# Patient Record
Sex: Female | Born: 1970 | Race: Black or African American | Hispanic: No | Marital: Single
Health system: Southern US, Community
[De-identification: ages and names within clinical notes are randomized; demographics above are authoritative.]

---

## 2000-08-12 ENCOUNTER — Emergency Department (HOSPITAL_COMMUNITY): Admission: EM | Admit: 2000-08-12 | Discharge: 2000-08-12 | Payer: Self-pay | Admitting: Internal Medicine

## 2000-08-12 ENCOUNTER — Encounter: Payer: Self-pay | Admitting: Emergency Medicine

## 2001-07-16 ENCOUNTER — Inpatient Hospital Stay (HOSPITAL_COMMUNITY): Admission: AD | Admit: 2001-07-16 | Discharge: 2001-07-30 | Payer: Self-pay | Admitting: Obstetrics and Gynecology

## 2001-07-16 ENCOUNTER — Encounter: Payer: Self-pay | Admitting: Obstetrics and Gynecology

## 2001-07-16 ENCOUNTER — Encounter (INDEPENDENT_AMBULATORY_CARE_PROVIDER_SITE_OTHER): Payer: Self-pay | Admitting: *Deleted

## 2001-07-26 ENCOUNTER — Encounter: Payer: Self-pay | Admitting: *Deleted

## 2002-03-24 ENCOUNTER — Encounter: Payer: Self-pay | Admitting: Emergency Medicine

## 2002-03-24 ENCOUNTER — Emergency Department (HOSPITAL_COMMUNITY): Admission: EM | Admit: 2002-03-24 | Discharge: 2002-03-25 | Payer: Self-pay | Admitting: Emergency Medicine

## 2007-09-01 ENCOUNTER — Ambulatory Visit (HOSPITAL_COMMUNITY): Admission: RE | Admit: 2007-09-01 | Discharge: 2007-09-01 | Payer: Self-pay | Admitting: Obstetrics & Gynecology

## 2007-09-14 ENCOUNTER — Ambulatory Visit: Payer: Self-pay | Admitting: Obstetrics & Gynecology

## 2007-09-18 ENCOUNTER — Ambulatory Visit (HOSPITAL_COMMUNITY): Admission: RE | Admit: 2007-09-18 | Discharge: 2007-09-18 | Payer: Self-pay | Admitting: Family Medicine

## 2007-10-12 ENCOUNTER — Ambulatory Visit: Payer: Self-pay | Admitting: *Deleted

## 2007-10-16 ENCOUNTER — Ambulatory Visit (HOSPITAL_COMMUNITY): Admission: RE | Admit: 2007-10-16 | Discharge: 2007-10-16 | Payer: Self-pay | Admitting: Family Medicine

## 2007-10-16 ENCOUNTER — Ambulatory Visit: Payer: Self-pay | Admitting: Obstetrics & Gynecology

## 2007-10-23 ENCOUNTER — Ambulatory Visit: Payer: Self-pay | Admitting: Family Medicine

## 2007-10-30 ENCOUNTER — Ambulatory Visit: Payer: Self-pay | Admitting: Obstetrics & Gynecology

## 2007-11-06 ENCOUNTER — Ambulatory Visit (HOSPITAL_COMMUNITY): Admission: RE | Admit: 2007-11-06 | Discharge: 2007-11-06 | Payer: Self-pay | Admitting: Obstetrics & Gynecology

## 2007-11-06 ENCOUNTER — Ambulatory Visit: Payer: Self-pay | Admitting: Obstetrics & Gynecology

## 2007-11-13 ENCOUNTER — Ambulatory Visit: Payer: Self-pay | Admitting: Obstetrics & Gynecology

## 2007-11-14 ENCOUNTER — Ambulatory Visit (HOSPITAL_COMMUNITY): Admission: RE | Admit: 2007-11-14 | Discharge: 2007-11-14 | Payer: Self-pay | Admitting: Family Medicine

## 2007-11-20 ENCOUNTER — Ambulatory Visit: Payer: Self-pay | Admitting: Obstetrics and Gynecology

## 2007-11-26 ENCOUNTER — Ambulatory Visit: Payer: Self-pay | Admitting: Physician Assistant

## 2007-11-26 ENCOUNTER — Inpatient Hospital Stay (HOSPITAL_COMMUNITY): Admission: AD | Admit: 2007-11-26 | Discharge: 2007-11-26 | Payer: Self-pay | Admitting: Obstetrics & Gynecology

## 2007-11-27 ENCOUNTER — Ambulatory Visit: Payer: Self-pay | Admitting: Obstetrics & Gynecology

## 2007-12-04 ENCOUNTER — Ambulatory Visit (HOSPITAL_COMMUNITY): Admission: RE | Admit: 2007-12-04 | Discharge: 2007-12-04 | Payer: Self-pay | Admitting: Obstetrics & Gynecology

## 2007-12-04 ENCOUNTER — Ambulatory Visit: Payer: Self-pay | Admitting: Family Medicine

## 2007-12-11 ENCOUNTER — Ambulatory Visit (HOSPITAL_COMMUNITY): Admission: RE | Admit: 2007-12-11 | Discharge: 2007-12-11 | Payer: Self-pay | Admitting: Family Medicine

## 2007-12-11 ENCOUNTER — Ambulatory Visit: Payer: Self-pay | Admitting: Obstetrics & Gynecology

## 2007-12-18 ENCOUNTER — Ambulatory Visit: Payer: Self-pay | Admitting: Obstetrics & Gynecology

## 2007-12-28 ENCOUNTER — Ambulatory Visit: Payer: Self-pay | Admitting: Obstetrics & Gynecology

## 2008-01-01 ENCOUNTER — Ambulatory Visit (HOSPITAL_COMMUNITY): Admission: RE | Admit: 2008-01-01 | Discharge: 2008-01-01 | Payer: Self-pay | Admitting: Family Medicine

## 2008-01-04 ENCOUNTER — Ambulatory Visit: Payer: Self-pay | Admitting: Gynecology

## 2008-01-11 ENCOUNTER — Ambulatory Visit: Payer: Self-pay | Admitting: Obstetrics & Gynecology

## 2008-01-18 ENCOUNTER — Ambulatory Visit: Payer: Self-pay | Admitting: Obstetrics & Gynecology

## 2008-01-22 ENCOUNTER — Ambulatory Visit (HOSPITAL_COMMUNITY): Admission: RE | Admit: 2008-01-22 | Discharge: 2008-01-22 | Payer: Self-pay | Admitting: Family Medicine

## 2008-01-25 ENCOUNTER — Ambulatory Visit: Payer: Self-pay | Admitting: Obstetrics & Gynecology

## 2008-01-31 ENCOUNTER — Ambulatory Visit: Payer: Self-pay | Admitting: Obstetrics & Gynecology

## 2008-02-08 ENCOUNTER — Ambulatory Visit: Payer: Self-pay | Admitting: Family Medicine

## 2008-02-08 ENCOUNTER — Encounter: Payer: Self-pay | Admitting: Obstetrics & Gynecology

## 2008-02-15 ENCOUNTER — Ambulatory Visit: Payer: Self-pay | Admitting: Obstetrics & Gynecology

## 2008-02-22 ENCOUNTER — Ambulatory Visit: Payer: Self-pay | Admitting: Obstetrics & Gynecology

## 2008-02-29 ENCOUNTER — Ambulatory Visit: Payer: Self-pay | Admitting: Family Medicine

## 2008-03-07 ENCOUNTER — Ambulatory Visit: Payer: Self-pay | Admitting: Obstetrics & Gynecology

## 2008-03-07 ENCOUNTER — Encounter: Payer: Self-pay | Admitting: Obstetrics & Gynecology

## 2008-03-08 ENCOUNTER — Encounter: Payer: Self-pay | Admitting: Obstetrics & Gynecology

## 2008-03-08 LAB — CONVERTED CEMR LAB
Chlamydia, DNA Probe: NEGATIVE
GC Probe Amp, Genital: NEGATIVE

## 2008-03-21 ENCOUNTER — Ambulatory Visit: Payer: Self-pay | Admitting: Obstetrics & Gynecology

## 2008-03-21 ENCOUNTER — Inpatient Hospital Stay (HOSPITAL_COMMUNITY): Admission: AD | Admit: 2008-03-21 | Discharge: 2008-03-24 | Payer: Self-pay | Admitting: Obstetrics & Gynecology

## 2008-03-21 ENCOUNTER — Ambulatory Visit: Payer: Self-pay | Admitting: Family Medicine

## 2008-03-21 LAB — CONVERTED CEMR LAB
AST: 22 units/L (ref 0–37)
Albumin: 2.9 g/dL — ABNORMAL LOW (ref 3.5–5.2)
BUN: 4 mg/dL — ABNORMAL LOW (ref 6–23)
CO2: 24 meq/L (ref 19–32)
Calcium: 9.1 mg/dL (ref 8.4–10.5)
Chloride: 102 meq/L (ref 96–112)
Glucose, Bld: 78 mg/dL (ref 70–99)
Hemoglobin: 12.6 g/dL (ref 12.0–15.0)
Potassium: 4.2 meq/L (ref 3.5–5.3)
RBC: 4.19 M/uL (ref 3.87–5.11)
WBC: 6.8 10*3/uL (ref 4.0–10.5)

## 2008-04-30 ENCOUNTER — Inpatient Hospital Stay (HOSPITAL_COMMUNITY): Admission: AD | Admit: 2008-04-30 | Discharge: 2008-04-30 | Payer: Self-pay | Admitting: Obstetrics & Gynecology

## 2008-04-30 ENCOUNTER — Ambulatory Visit: Payer: Self-pay | Admitting: Family Medicine

## 2010-04-16 IMAGING — US US OB FOLLOW-UP
1 series · 18 of 28 positions shown · non-contrast
Comparison: none

OBSTETRICAL ULTRASOUND:
 This ultrasound was performed in The [HOSPITAL], and the [facility] report will be stored to [REDACTED] PACS.

[Series 1: us ob follow-up · 18 of 36 slices shown]
[im 1/36]
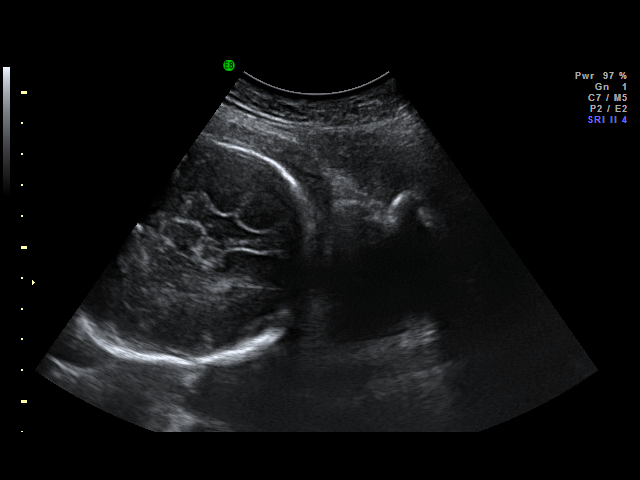
[im 3/36]
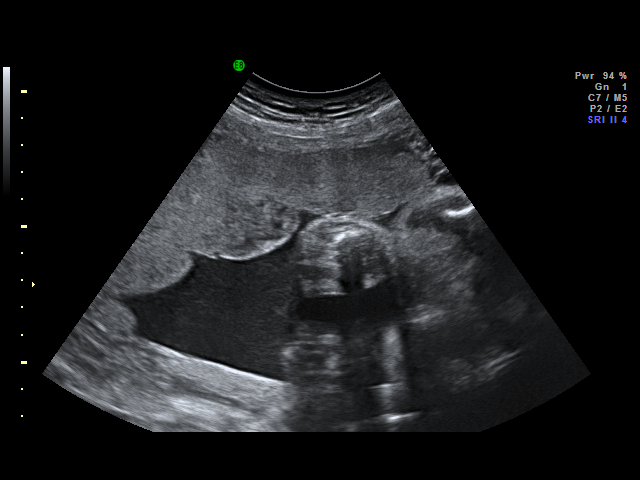
[im 4/36]
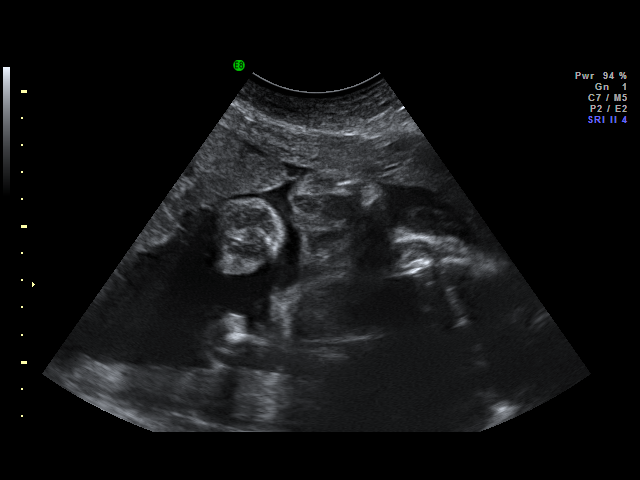
[im 7/36]
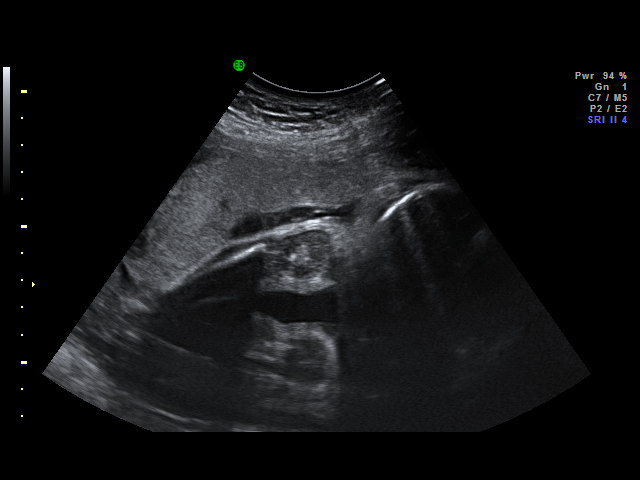
[im 10/36]
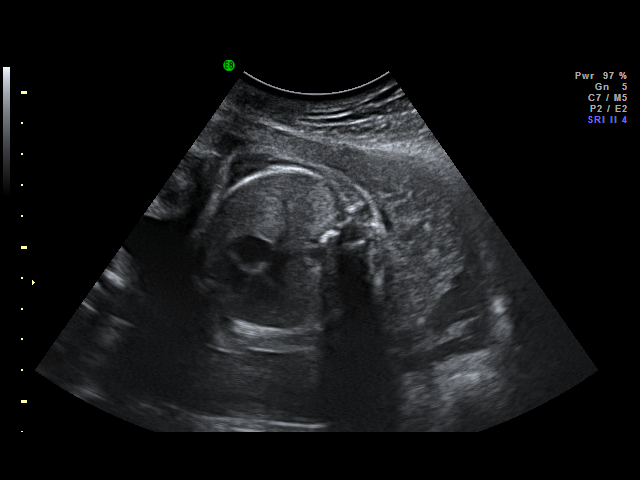
[im 11/36]
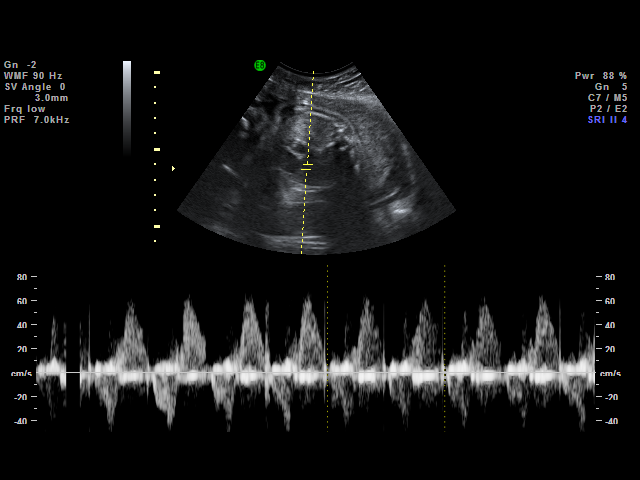
[im 13/36]
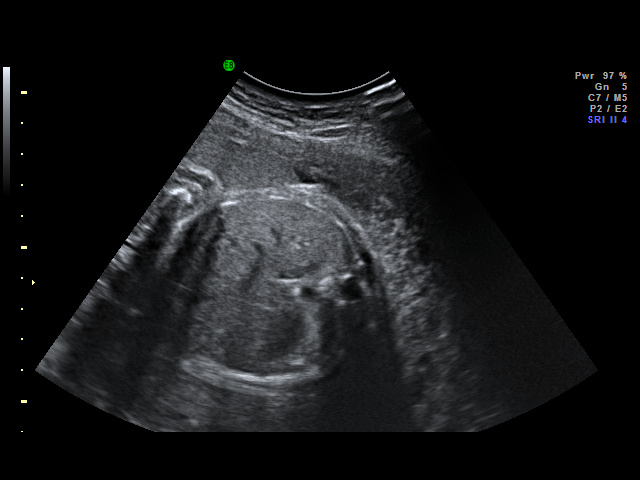
[im 15/36]
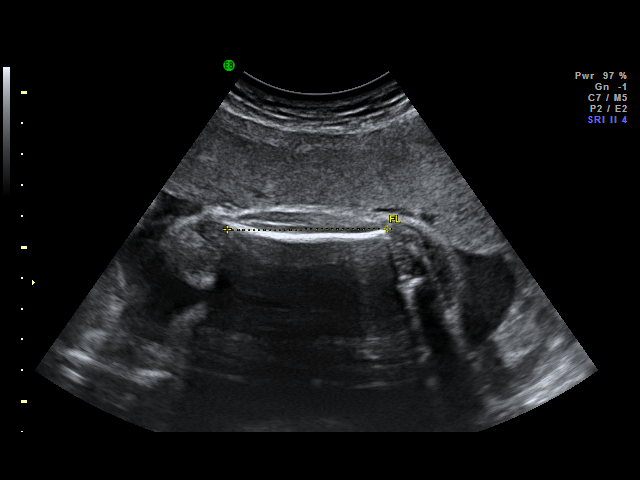
[im 17/36]
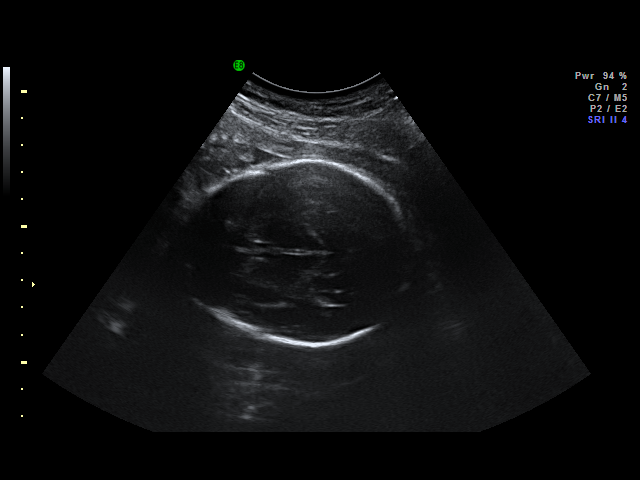
[im 19/36]
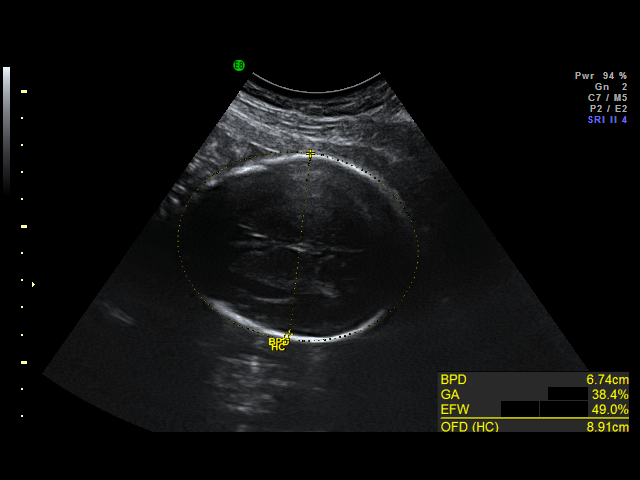
[im 21/36]
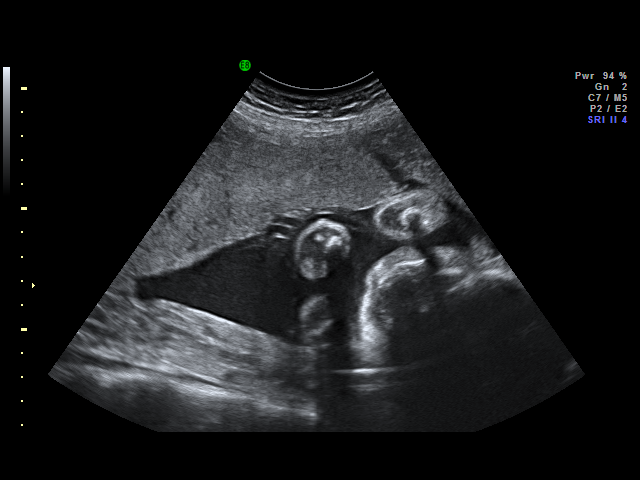
[im 23/36]
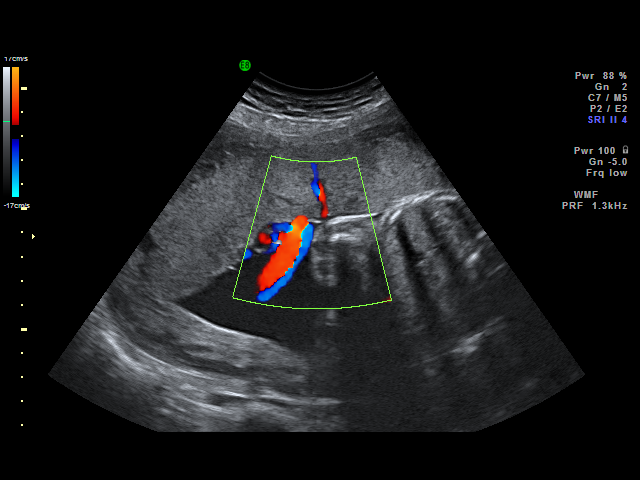
[im 25/36]
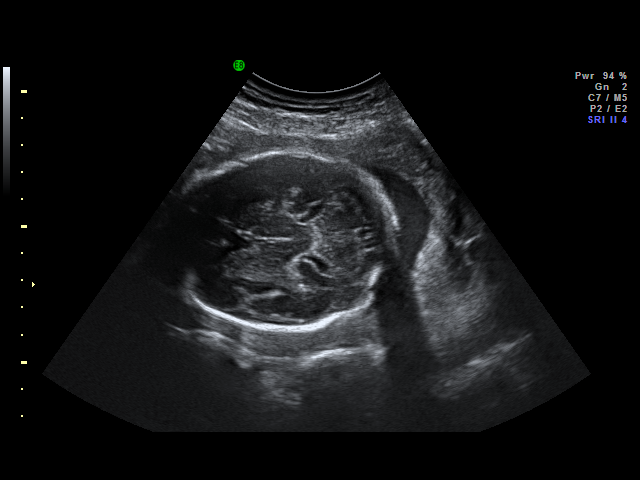
[im 28/36]
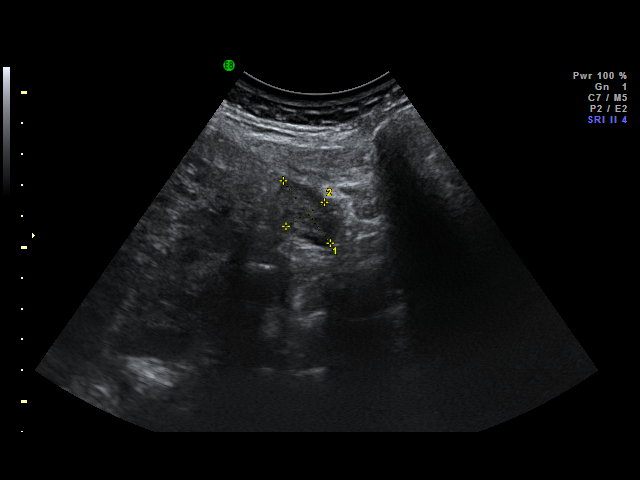
[im 29/36]
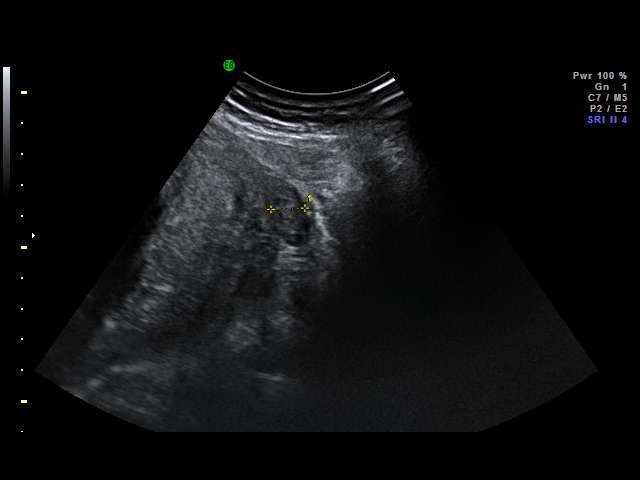
[im 32/36]
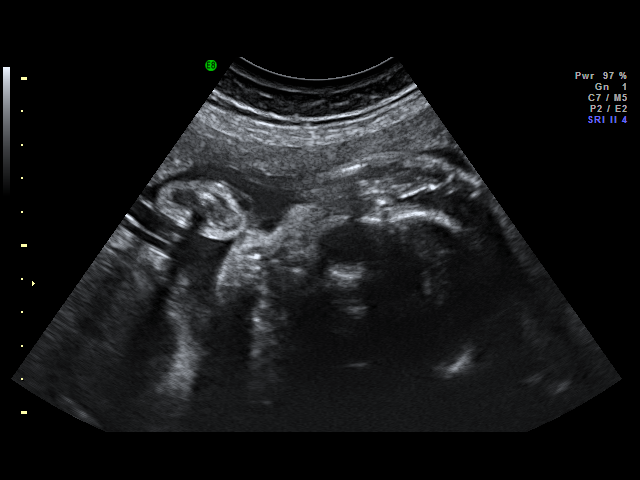
[im 33/36]
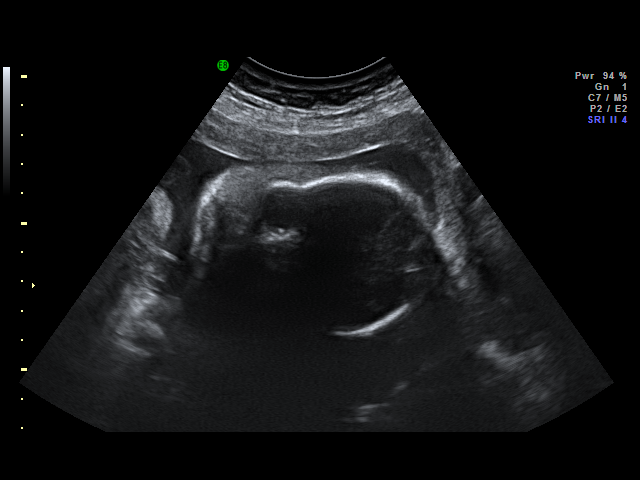
[im 36/36]
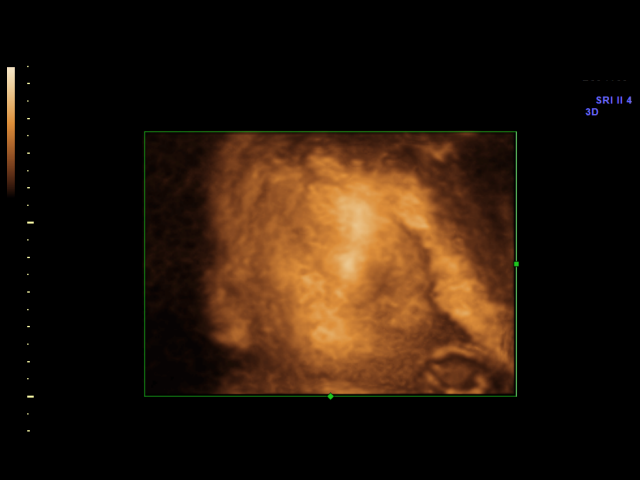

[18 of 28 positions shown; findings below may reference images not displayed]

IMPRESSION: [facility] has also been faxed to the ordering physician.

## 2010-09-01 NOTE — Discharge Summary (Signed)
NAME:  Carmen Salinas, Carmen Salinas NO.:  [id number]   MEDICAL RECORD NO.:  [id number]          PATIENT TYPE:  INP   LOCATION:                                FACILITY:  [facility]   PHYSICIAN:  Lesly Dukes, M.D. DATE OF BIRTH:  1971-03-11   DATE OF ADMISSION:  03/21/2008  DATE OF DISCHARGE:  03/24/2008                               DISCHARGE SUMMARY   ADMITTING DIAGNOSES:  1. Intrauterine pregnancy at 47 and 4/7 weeks' gestation.  2. Prior cesarean section x2 with desire for repeat.  3. Active labor.  4. Undesired fertility.   DISCHARGE DIAGNOSIS:  1. Intrauterine pregnancy at 73 and 4/7 weeks' gestation.  2. Prior cesarean section x2 with desire for repeat.  3. Active labor.  4. Undesired fertility.   HOSPITAL COURSE:  Carmen Salinas is a 40 year old gravida 4, para 0-3-0-2,  who presented at 38-4/7 weeks.  The patient had a history of 2 prior  cesarean sections and was scheduled for repeat cesarean section on  March 24, 2008, however, when she presented she was 4-cm dilated and  having contractions every 2-3 minutes.  The patient was prepared for the  operating room.  Dr. Vela Prose Duru and Dr. Odie Sera performed the  procedure.  The patient had a spinal for anesthesia.  She tolerated the  cesarean section well and was taken to the recovery room in good  condition.  The infant was a viable female in cephalic presentation,  weight of 7 pounds 4 ounces with Apgars of 8 and 9.  She was taken to  the full-term nursery in good condition.  By postop day #1, the patient  was doing well.  Her vital signs were stable.  Hemoglobin was 10.5 and  had been 13.1 preoperatively.  Postop day #2, the patient continued to  do well.  Infant was bottle feeding, vital signs were stable.  By day  #3, she was deemed to receive the full benefit of her hospital stay and  discharged home.   DISCHARGE INSTRUCTIONS:  Per postpartum handout.   DISCHARGE MEDICATIONS:  1. Motrin 600 mg one p.o.  q.6 h. p.r.n. pain.  2. Tylox one to two p.o. q.3-4 h. p.r.n. pain.   DISCHARGE FOLLOWUP:  Cherlynn Polo will be removed on March 26, 2008, by  [employer] nurse and the patient will follow up in 6 weeks at the  [facility] Department or as needed.      Cam Hai, C.N.M.      Lesly Dukes, M.D.  Electronically Signed    KS/MEDQ  D:  03/24/2008  T:  03/24/2008  Job:  [id number]

## 2010-09-01 NOTE — Op Note (Signed)
NAME:  Carmen Salinas, Carmen Salinas NO.:  [id number]   MEDICAL RECORD NO.:  [id number]         PATIENT TYPE:  WINP   LOCATION:  WOC                           FACILITY:  [facility]   PHYSICIAN:  Norton Blizzard, MD    DATE OF BIRTH:  1971-01-27   DATE OF PROCEDURE:  03/21/2008  DATE OF DISCHARGE:                               OPERATIVE REPORT   PREOPERATIVE DIAGNOSES:  1. Intrauterine pregnancy at 38-4/7th weeks' gestation.  2. Prior cesarean section x2.  3. Active labor.  4. Undesired fertility.   POSTOPERATIVE DIAGNOSES:  1. Intrauterine pregnancy at 38-4/7th weeks' gestation.  2. Prior cesarean section x2.  3. Active labor.  4. Undesired fertility.   PROCEDURE:  Repeat low transverse cesarean section and bilateral tubal  sterilization using Filshie clips via Pfannenstiel incision.   SURGEON:  Norton Blizzard, MD   ASSISTANT:  Odie Sera, DO   ANESTHESIA:  Spinal.   INDICATIONS:  The patient is a 40 year old gravida 4, para 0-3-0-2, who  presented at 38-4/7th weeks.  The patient had a history of 2 cesarean  sections in the past and was scheduled for repeat cesarean section on  March 24, 2008.  However, she presented to the emergency room in  active labor.  On examination, she was noted to be 4 cm dilated, 70%  effaced, 0 station and having contractions every 2-3 minutes that were  moderate to severe in intensity.  Given that she was in active labor,  the plan was made to proceed with cesarean section.  The patient also  has undesired fertility and signed her Medicaid papers for a tubal  ligation more than 30 days prior to her cesarean section.  The risks of  the surgery including bleeding, infection, injury to surrounding organs,  and need for additional procedures including hysterectomy in the event  of a life-threatening bleed, the risk of failure of the bilateral tubal  sterilization of 5-7 in a 1000 with increased risk of ectopic gestation  if pregnancy occurs,  were discussed with the patient and written  informed consent was obtained.   FINDINGS:  Viable female infant, cephalic presentation. Loose nuchal  cord x2.  There was heavy meconium-stained amniotic fluid.  Apgars were  8 and 9.  Weight was 7 pounds 4 ounces.  There was normal placenta with  a 3-vessel cord.  Normal uterus, bilateral tubes and ovaries.  There was  a moderate amount of scar tissue around the fascia and a large rectus  muscle diastasis.  Upon opening of the fascia, the uterus was  immediately encountered.   INTRAVENOUS FLUIDS:  1200 mL.   URINE OUTPUT:  200 mL.   ESTIMATED BLOOD LOSS:  700 mL.   SPECIMENS:  Placenta.   DISPOSITION:  Specimens to labor and delivery.   COMPLICATIONS:  None immediately.   PROCEDURE DETAILS:  The patient was taken to the operating room where  spinal anesthesia was administered and found to be adequate.  She also  received IV Ancef prior to the procedure.  She was then prepped and  draped in a sterile manner, and a Foley  catheter was inserted into her  bladder and attached to constant gravity.  A Pfannenstiel incision was  made over her preexisting scar using a scalpel and taken down to the  layer of fascia.  The fascia was incised in the midline bilaterally  using Mayo scissors.  The fascia was noted to be very tough given her  prior cesarean sections.  Immediately after incising the fascia, the  uterus was immediately encountered and there was a large rectus muscle  diastasis noted.  At this point to make more room, the rectus muscle on  the patient's left was transected about 2 cm in its width.  Overall,  good hemostasis was noted.  Attention was then turned to the uterus  where a low-transverse hysterotomy was made with scalpel and extended in  a blunt fashion.  The amniotic fluid sac was then ruptured for thick  meconium-stained fluid.  The infant's head was encountered and  delivered.  The nares and mouth were suctioned with  bulb suction and the  rest of the corpus was delivered without complication.  The umbilical  cord was clamped and cut, and the infant was handed over to the awaiting  pediatricians.  Cord blood was then collected according to protocol.  Fundal massage was administered and the placenta delivered intact with 3-  vessel cord.  The uterus was then exteriorized from the abdomen and  cleared of all clot and debris.  The hysterotomy was repaired with 0  Monocryl in a running interlocking fashion.  Figure-of-eight stitches of  0 Monocryl were used to control small areas of bleeding on the incision.  Overall, good hemostasis was noted.  Attention was then turned to the  fallopian tubes, and Filshie clips were placed about 3-cm from the  cornua on each fallopian tubes providing total occlusion.  Overall, good  hemostasis was noted.  The uterus was then returned to the abdomen.  The  pelvis and gutters were cleared of all clots and debris.  The fascia was  then reapproximated using 0 PDS in a running fashion.  It was noted at  this point that there was some bleeding coming from the rectus muscle  bed which was controlled using coagulation.  There was also some  bleeding from the subcutaneous tissues which was also controlled.  The  skin was closed with staples.  A pressure dressing was placed at the end  of the case.  The patient tolerated the procedure well.  Sponge,  instrument, and needle counts were correct x2.  She was taken to  recovery room awake and in stable condition.      Norton Blizzard, MD  Electronically Signed     UAD/MEDQ  D:  03/21/2008  T:  03/22/2008  Job:  [id number]

## 2010-09-04 NOTE — Discharge Summary (Signed)
NAMEMarland Kitchen  Carmen Salinas, Carmen Salinas NO.:  [id number]   MEDICAL RECORD NO.:  [id number]                   PATIENT TYPE:   LOCATION:                                       FACILITY:   PHYSICIAN:  Nilda Simmer, M.D.                  DATE OF BIRTH:  1970/11/06   DATE OF ADMISSION:  07/16/2001  DATE OF DISCHARGE:  07/30/2001                                 DISCHARGE SUMMARY   CONSULTS:  None.   PROCEDURE:  1. Low transverse cesarean section.  2. Diagnostic amniocentesis.  3. Complete obstetric ultrasound.   DISCHARGE DIAGNOSES:  1. Third trimester vaginal bleeding.  2. Preterm contractions.  3. History of placental abruption with previous pregnancy.  4. Intrauterine pregnancy at 42 and 5/[redacted] weeks gestation.  5. Status post repeat low transverse cesarean section.  6. Delivery of single live born infant.   DISCHARGE MEDICATIONS:  1. Ibuprofen 600 mg one p.o. q.6h. mild pain.  2. Percocet 5/325 one to two tablets p.o. q.4h. p.r.n. stronger pain.  3. Zoloft 50 mg one tablet p.o. q.d.  4. Ferrous sulfate 325 mg one tablet p.o. q.d. x3 months.   HOSPITAL COURSE:  The patient is a 40 year old G3, P0-2-0-1 presenting  secondary to preterm contractions and vaginal bleeding.  The patient  presented with no previous prenatal care and was found by last menstrual  period as well as third trimester ultrasound with an estimated gestational  dating of 64 and [redacted] weeks gestation.  Of note, patient has a history of a  previous placental abruption with a previous pregnancy and patient was  admitted for observation to rule out recurrent placenta abruption.  During  hospitalization patient without further episodes of vaginal bleeding and  minimal preterm contractions.  The patient was initiated on ampicillin  therapy until GBS status was determined to be negative.  DIC panel as well  as antiphospholipid panels were negative during hospitalization.  On  hospital day number 10  patient agreed to an amniocentesis to determine fetal  lung maturity.  Amniostat was positive and was consistent with fetal lung  maturity.  At that time patient underwent repeat low transverse cesarean  section.  Postpartum period was uneventful other than anemia secondary to  acute blood loss with a discharge hemoglobin of 7.2.   DISCHARGE LABORATORIES:  White blood cells 10.8, hemoglobin 7.2, hematocrit  22.2, platelets 182,000.  Amniostat positive.  PT 11.7, INR 0.8, PTT 25.  Blood type AB+.  Antibody negative.  ANA negative.  Antiphospholipid panel  negative.  Fibrinogen 555.  D-dimer 1.59.  GBS negative.  Gonorrhea  negative.  Chlamydia probe negative.  Wet prep:  Few wbc's and few bacteria.  Urine drug screen was negative.  Urinalysis revealed yellow clear, specific  gravity 1.010, pH 5.0, otherwise negative.  Rubella immune.  RPR negative.  Hepatitis surface antigen negative.    DISCHARGE INSTRUCTIONS:   WOUND  CARE:  Keep incision clean and dry.  Apply peroxide if drainage.  Present to maternity admissions at [facility] if significant drainage,  foul smell, or development of fever.   DIET:  Regular.   ACTIVITY:  No heavy lifting for the next six weeks.  Sexual activity:  Nothing per vagina x6 weeks.   FOLLOW UP:  The patient will follow up at [facility] in six weeks for a  postpartum evaluation.                                               Nilda Simmer, M.D.    KS/MEDQ  D:  12/25/2001  T:  12/25/2001  Job:  [id number]

## 2010-09-04 NOTE — Op Note (Signed)
[facility]  Patient:    Carmen Salinas, Carmen Salinas Visit Number: [id number] MRN: [id number]          Service Type: OBS Location: [id number] Attending Physician:  Amada Kingfisher. Dictated by:   Ed Blalock. Burnadette Peter, M.D. Proc. Date: 07/27/01 Admit Date:  07/16/2001                             Operative Report  DATE OF BIRTH:                September 20, 1984  PREOPERATIVE DIAGNOSES:       1. Intrauterine pregnancy at 53 and 1 weeks.                               2. Preterm contractions and vaginal bleeding.                               3. History of abruption.                               4. Positive amniostat.  POSTOPERATIVE DIAGNOSES:      1. Intrauterine pregnancy at 19 and 1 weeks.                               2. Preterm contractions and vaginal bleeding.                               3. History of abruption.                               4. Positive amniostat.                               5. Fibroid uterus.  SURGEON:                      Dr. Orlene Erm  ASSISTANT:                    Ed Blalock. Burnadette Peter, M.D.  PROCEDURE:                    Lower transverse cesarean section.  ANESTHESIA:                   Spinal.  FLUIDS:                       1800 cc LR.  ESTIMATED BLOOD LOSS:         800 cc.  URINE OUTPUT:                 In-and-out for 100 cc clear urine.  COMPLICATIONS:                None.  INDICATIONS:                  A 40 year old G3, P0-2-0-1 at 40 and 1 with preterm contractions, vaginal bleeding, history of abruption, and a positive amniostat.  FINDINGS:  Apgars 9 and 9.  Weight 5 pounds 4 ounces. Fibroid fundus of the uterus.  Normal ovaries and tubes.  CONDITION:                    Stable in PACU.  SPECIMEN:                     Placenta to pathology.  PROCEDURE:                    The patient was taken to the operating room where spinal anesthesia was placed and was adequate.  She was then prepped and draped in a  normal sterile fashion in dorsal supine position.  Pfannenstiel skin incision was made through the old scar and carried down through the underlying layer of fascia with Bovie.  The fascia was incised in the midline and the incision extended laterally with the Mayo scissors.  The superior aspect of the fascial incision was then grasped with the Kocher clamps, elevated, and the underlying rectus muscles dissected off bluntly.  Attention was then turned to the inferior aspect of this incision which in a similar fashion was grasped, tented up with the Kocher clamps, and rectus muscles dissected off bluntly.  The rectus muscles were then separated in the midline and the peritoneum tented up and entered sharply with the Metzenbaum scissors. Peritoneal incision was then extended superiorly and inferiorly with good visualization.  The bladder blade was inserted and the vesicouterine peritoneum identified, grasped with pickups, and entered sharply with Metzenbaum scissors.  This incision was extended laterally and bladder flap created digitally as well as sharply with the Metzenbaum scissors.  The bladder blade was then reinserted in the lower uterine segment, was scored initially in a ______ fashion and then incised centrally in a transverse fashion.  The uterine incision was then extended laterally with stretching laterally and upward.  The bladder blade was removed.  The infants head was delivered atraumatically.  The nose and mouth were bulb suctioned.  The infant was then delivered and the cord clamped and cut.  The infant was handed off to the awaiting pediatricians.  Cord gases were not sent.  The placenta was removed manually by expressing and the uterus exteriorized and cleared of all clot and debris.  Uterine incision was then repaired with 0 Vicryl in a running locked fashion and the uterus was returned to the abdomen. The gutters were cleared of all clots.  The fascia was reapproximated  with 0 Vicryl including a small button hole in the superior aspect of the fascia. The skin was closed with staples.  The patient tolerated the procedure well. Sponge, lap, and needle counts were correct x2.  Ancef 2 g was given at cord clamp.  The patient was taken to the recovery room in stable condition. Dictated by:   Ed Blalock. Burnadette Peter, M.D. Attending Physician:  Amada Kingfisher. DD:  07/27/01 TD:  07/28/01 Job: [id number]

## 2011-01-13 LAB — POCT URINALYSIS DIP (DEVICE)
Bilirubin Urine: NEGATIVE
Glucose, UA: NEGATIVE
Nitrite: NEGATIVE
pH: 6

## 2011-01-14 LAB — POCT URINALYSIS DIP (DEVICE)
Bilirubin Urine: NEGATIVE
Bilirubin Urine: NEGATIVE
Nitrite: NEGATIVE
Nitrite: NEGATIVE
Nitrite: NEGATIVE
Protein, ur: NEGATIVE
Protein, ur: NEGATIVE
Urobilinogen, UA: 0.2
Urobilinogen, UA: 0.2
pH: 8
pH: 7
pH: 7.5

## 2011-01-15 LAB — POCT URINALYSIS DIP (DEVICE)
Glucose, UA: NEGATIVE
Ketones, ur: NEGATIVE
Nitrite: NEGATIVE
Operator id: 194561
Protein, ur: NEGATIVE
Specific Gravity, Urine: 1.025
Urobilinogen, UA: 0.2
pH: 5.5

## 2011-01-15 LAB — WET PREP, GENITAL

## 2011-01-18 LAB — POCT URINALYSIS DIP (DEVICE)
Glucose, UA: NEGATIVE
Glucose, UA: NEGATIVE
Glucose, UA: NEGATIVE
Nitrite: NEGATIVE
Operator id: 159681
Operator id: 135281
Operator id: 200901
Protein, ur: 30 — AB
Protein, ur: NEGATIVE
Specific Gravity, Urine: 1.02
Specific Gravity, Urine: 1.015
Specific Gravity, Urine: 1.02
Urobilinogen, UA: 1
Urobilinogen, UA: 1
Urobilinogen, UA: 1

## 2011-01-19 LAB — POCT URINALYSIS DIP (DEVICE)
Bilirubin Urine: NEGATIVE
Glucose, UA: NEGATIVE
Glucose, UA: NEGATIVE
Nitrite: NEGATIVE
Nitrite: NEGATIVE
Operator id: 287931
Protein, ur: NEGATIVE
Specific Gravity, Urine: 1.02
Urobilinogen, UA: 0.2
Urobilinogen, UA: 1

## 2011-01-20 LAB — POCT URINALYSIS DIP (DEVICE)
Bilirubin Urine: NEGATIVE
Glucose, UA: NEGATIVE
Hgb urine dipstick: NEGATIVE
Nitrite: NEGATIVE
Urobilinogen, UA: 0.2

## 2011-01-21 LAB — CBC
HCT: 39.8 % (ref 36.0–46.0)
Hemoglobin: 10.5 g/dL — ABNORMAL LOW (ref 12.0–15.0)
Hemoglobin: 13.1 g/dL (ref 12.0–15.0)
Platelets: 167 10*3/uL (ref 150–400)
RBC: 3.46 MIL/uL — ABNORMAL LOW (ref 3.87–5.11)
RDW: 14.4 % (ref 11.5–15.5)
WBC: 8 10*3/uL (ref 4.0–10.5)
WBC: 8.8 10*3/uL (ref 4.0–10.5)

## 2011-01-21 LAB — POCT URINALYSIS DIP (DEVICE)
Bilirubin Urine: NEGATIVE
Ketones, ur: NEGATIVE mg/dL
Nitrite: NEGATIVE
Protein, ur: 30 mg/dL — AB
pH: 7 (ref 5.0–8.0)

## 2019-08-02 ENCOUNTER — Ambulatory Visit: Payer: Medicaid Other | Attending: Internal Medicine

## 2019-08-02 DIAGNOSIS — Z23 Encounter for immunization: Secondary | ICD-10-CM

## 2019-08-02 NOTE — Progress Notes (Signed)
   Covid-19 Vaccination Clinic  Name:  Carmen Salinas    MRN: [id number] DOB: 18-Oct-1970  08/02/2019  [name] was observed post Covid-19 immunization for 15 minutes without incident. She was provided with Vaccine Information Sheet and instruction to access the V-Safe system.   [name] was instructed to call 911 with any severe reactions post vaccine: Marland Kitchen Difficulty breathing  . Swelling of face and throat  . A fast heartbeat  . A bad rash all over body  . Dizziness and weakness   Immunizations Administered    Name Date Dose VIS Date Route   Pfizer COVID-19 Vaccine 08/02/2019  8:13 AM 0.3 mL 03/30/2019 Intramuscular   Manufacturer: [employer], [employer]   Lot: [id number]   NDC: [address]-8

## 2019-08-27 ENCOUNTER — Ambulatory Visit: Payer: Medicaid Other | Attending: Internal Medicine

## 2019-08-27 DIAGNOSIS — Z23 Encounter for immunization: Secondary | ICD-10-CM

## 2019-08-27 NOTE — Progress Notes (Signed)
   Covid-19 Vaccination Clinic  Name:  YULIA ULRICH    MRN: [id number] DOB: 1971/03/25  08/27/2019  Ms. Liberman was observed post Covid-19 immunization for 15 minutes without incident. She was provided with Vaccine Information Sheet and instruction to access the V-Safe system.   Ms. Mellette was instructed to call 911 with any severe reactions post vaccine: Marland Kitchen Difficulty breathing  . Swelling of face and throat  . A fast heartbeat  . A bad rash all over body  . Dizziness and weakness   Immunizations Administered    Name Date Dose VIS Date Route   Pfizer COVID-19 Vaccine 08/27/2019  8:13 AM 0.3 mL 06/13/2018 Intramuscular   Manufacturer: [employer], [employer]   Lot: [id number]   NDC: [address]-7
